# Patient Record
Sex: Female | Born: 1960 | Race: Black or African American | Hispanic: No | Marital: Single | Smoking: Current every day smoker
Health system: Southern US, Community
[De-identification: ages and names within clinical notes are randomized; demographics above are authoritative.]

## PROBLEM LIST (undated history)

## (undated) DIAGNOSIS — I639 Cerebral infarction, unspecified: Secondary | ICD-10-CM

## (undated) DIAGNOSIS — C801 Malignant (primary) neoplasm, unspecified: Secondary | ICD-10-CM

## (undated) DIAGNOSIS — D259 Leiomyoma of uterus, unspecified: Secondary | ICD-10-CM

## (undated) HISTORY — PX: DENTAL SURGERY: SHX609

---

## 2005-07-02 ENCOUNTER — Emergency Department: Payer: Self-pay | Admitting: Emergency Medicine

## 2005-07-18 ENCOUNTER — Emergency Department: Payer: Self-pay | Admitting: Unknown Physician Specialty

## 2005-07-26 ENCOUNTER — Ambulatory Visit: Payer: Self-pay | Admitting: Vascular Surgery

## 2006-05-08 ENCOUNTER — Emergency Department: Payer: Self-pay | Admitting: Emergency Medicine

## 2007-05-20 ENCOUNTER — Emergency Department: Payer: Self-pay | Admitting: Emergency Medicine

## 2008-01-05 ENCOUNTER — Inpatient Hospital Stay: Payer: Self-pay | Admitting: Unknown Physician Specialty

## 2009-01-13 ENCOUNTER — Emergency Department: Payer: Self-pay | Admitting: Emergency Medicine

## 2009-01-25 ENCOUNTER — Emergency Department: Payer: Self-pay | Admitting: Emergency Medicine

## 2009-10-07 ENCOUNTER — Ambulatory Visit: Payer: Self-pay

## 2009-10-16 ENCOUNTER — Emergency Department: Payer: Self-pay | Admitting: Emergency Medicine

## 2010-12-29 ENCOUNTER — Emergency Department: Payer: Self-pay

## 2011-04-07 ENCOUNTER — Inpatient Hospital Stay: Payer: Self-pay | Admitting: Internal Medicine

## 2012-02-01 ENCOUNTER — Emergency Department: Payer: Self-pay | Admitting: Emergency Medicine

## 2012-02-01 LAB — CBC
HGB: 13.6 g/dL (ref 12.0–16.0)
MCHC: 32.2 g/dL (ref 32.0–36.0)
MCV: 80 fL (ref 80–100)
Platelet: 231 10*3/uL (ref 150–440)
RBC: 5.3 10*6/uL — ABNORMAL HIGH (ref 3.80–5.20)
WBC: 5.1 10*3/uL (ref 3.6–11.0)

## 2012-02-02 LAB — COMPREHENSIVE METABOLIC PANEL
Anion Gap: 11 (ref 7–16)
BUN: 6 mg/dL — ABNORMAL LOW (ref 7–18)
Chloride: 108 mmol/L — ABNORMAL HIGH (ref 98–107)
Co2: 26 mmol/L (ref 21–32)
EGFR (African American): >60
EGFR (Non-African Amer.): >60
Osmolality: 285 (ref 275–301)
Potassium: 3.9 mmol/L (ref 3.5–5.1)
Sodium: 145 mmol/L (ref 136–145)

## 2012-02-02 LAB — URINALYSIS, COMPLETE
Bacteria: NONE SEEN
Glucose,UR: NEGATIVE mg/dL (ref 0–75)
Ketone: NEGATIVE
Nitrite: NEGATIVE
Ph: 5 (ref 4.5–8.0)
Protein: 30
RBC,UR: 1 /HPF (ref 0–5)
Squamous Epithelial: 2
WBC UR: 2 /HPF (ref 0–5)

## 2012-02-02 LAB — WET PREP, GENITAL

## 2012-02-06 ENCOUNTER — Emergency Department: Payer: Self-pay | Admitting: Emergency Medicine

## 2012-02-06 LAB — CBC
HCT: 39 % (ref 35.0–47.0)
HGB: 12.6 g/dL (ref 12.0–16.0)
MCH: 25.7 pg — ABNORMAL LOW (ref 26.0–34.0)
MCHC: 32.4 g/dL (ref 32.0–36.0)
RBC: 4.93 10*6/uL (ref 3.80–5.20)

## 2012-02-06 LAB — URINALYSIS, COMPLETE
Ketone: NEGATIVE
Leukocyte Esterase: NEGATIVE
Nitrite: NEGATIVE
Ph: 5 (ref 4.5–8.0)
Protein: NEGATIVE
RBC,UR: 1 /HPF (ref 0–5)
Specific Gravity: 1.005 (ref 1.003–1.030)

## 2012-08-10 ENCOUNTER — Emergency Department: Payer: Self-pay | Admitting: Emergency Medicine

## 2012-08-10 LAB — BASIC METABOLIC PANEL
Anion Gap: 11 (ref 7–16)
Calcium, Total: 9 mg/dL (ref 8.5–10.1)
Creatinine: 0.72 mg/dL (ref 0.60–1.30)
EGFR (African American): >60
EGFR (Non-African Amer.): >60
Glucose: 84 mg/dL (ref 65–99)
Osmolality: 273 (ref 275–301)
Potassium: 3.7 mmol/L (ref 3.5–5.1)
Sodium: 138 mmol/L (ref 136–145)

## 2012-08-10 LAB — CBC
HCT: 42.5 % (ref 35.0–47.0)
HGB: 14.1 g/dL (ref 12.0–16.0)
MCH: 25.8 pg — ABNORMAL LOW (ref 26.0–34.0)
MCHC: 33.1 g/dL (ref 32.0–36.0)
Platelet: 303 10*3/uL (ref 150–440)
RBC: 5.44 10*6/uL — ABNORMAL HIGH (ref 3.80–5.20)
WBC: 5.8 10*3/uL (ref 3.6–11.0)

## 2012-08-10 LAB — ETHANOL
Ethanol %: 0.286 % — ABNORMAL HIGH (ref 0.000–0.080)
Ethanol: 286 mg/dL

## 2012-12-17 ENCOUNTER — Emergency Department: Payer: Self-pay | Admitting: Emergency Medicine

## 2012-12-17 LAB — CBC
HCT: 40.9 % (ref 35.0–47.0)
MCH: 24.7 pg — ABNORMAL LOW (ref 26.0–34.0)
MCHC: 31.8 g/dL — ABNORMAL LOW (ref 32.0–36.0)
Platelet: 265 10*3/uL (ref 150–440)
RDW: 14.6 % — ABNORMAL HIGH (ref 11.5–14.5)
WBC: 6 10*3/uL (ref 3.6–11.0)

## 2012-12-17 LAB — COMPREHENSIVE METABOLIC PANEL
Albumin: 4 g/dL (ref 3.4–5.0)
Alkaline Phosphatase: 94 U/L (ref 50–136)
Bilirubin,Total: 0.3 mg/dL (ref 0.2–1.0)
Chloride: 105 mmol/L (ref 98–107)
EGFR (African American): >60
Osmolality: 271 (ref 275–301)
Potassium: 4 mmol/L (ref 3.5–5.1)
SGOT(AST): 34 U/L (ref 15–37)
SGPT (ALT): 32 U/L (ref 12–78)
Sodium: 137 mmol/L (ref 136–145)
Total Protein: 8.5 g/dL — ABNORMAL HIGH (ref 6.4–8.2)

## 2012-12-17 LAB — SALICYLATE LEVEL: Salicylates, Serum: 5 mg/dL — ABNORMAL HIGH

## 2012-12-17 LAB — DRUG SCREEN, URINE
Amphetamines, Ur Screen: NEGATIVE (ref ?–1000)
Benzodiazepine, Ur Scrn: NEGATIVE (ref ?–200)
Cannabinoid 50 Ng, Ur ~~LOC~~: NEGATIVE (ref ?–50)
MDMA (Ecstasy)Ur Screen: NEGATIVE (ref ?–500)
Methadone, Ur Screen: NEGATIVE (ref ?–300)
Opiate, Ur Screen: NEGATIVE (ref ?–300)
Tricyclic, Ur Screen: NEGATIVE (ref ?–1000)

## 2014-04-23 ENCOUNTER — Emergency Department: Payer: Self-pay | Admitting: Emergency Medicine

## 2014-04-23 LAB — CBC WITH DIFFERENTIAL/PLATELET
BASOS ABS: 0.1 10*3/uL (ref 0.0–0.1)
BASOS PCT: 1.6 %
EOS ABS: 0.1 10*3/uL (ref 0.0–0.7)
EOS PCT: 2.2 %
HCT: 42.5 % (ref 35.0–47.0)
HGB: 13.2 g/dL (ref 12.0–16.0)
LYMPHS PCT: 34.7 %
Lymphocyte #: 2.4 10*3/uL (ref 1.0–3.6)
MCH: 24.6 pg — AB (ref 26.0–34.0)
MCHC: 31 g/dL — AB (ref 32.0–36.0)
MCV: 79 fL — ABNORMAL LOW (ref 80–100)
MONOS PCT: 6.9 %
Monocyte #: 0.5 x10 3/mm (ref 0.2–0.9)
Neutrophil #: 3.8 10*3/uL (ref 1.4–6.5)
Neutrophil %: 54.6 %
Platelet: 279 10*3/uL (ref 150–440)
RBC: 5.36 10*6/uL — ABNORMAL HIGH (ref 3.80–5.20)
RDW: 14.7 % — ABNORMAL HIGH (ref 11.5–14.5)
WBC: 6.9 10*3/uL (ref 3.6–11.0)

## 2014-04-23 LAB — ETHANOL
ETHANOL LVL: 311 mg/dL — AB
Ethanol %: 0.311 % (ref 0.000–0.080)

## 2014-04-23 LAB — URINALYSIS, COMPLETE
BILIRUBIN, UR: NEGATIVE
Blood: NEGATIVE
Glucose,UR: NEGATIVE mg/dL (ref 0–75)
Ketone: NEGATIVE
Leukocyte Esterase: NEGATIVE
Nitrite: NEGATIVE
PH: 5 (ref 4.5–8.0)
Protein: NEGATIVE
RBC,UR: NONE SEEN /HPF (ref 0–5)
Specific Gravity: 1.002 (ref 1.003–1.030)
WBC UR: 1 /HPF (ref 0–5)

## 2014-04-23 LAB — COMPREHENSIVE METABOLIC PANEL
ALBUMIN: 3.8 g/dL (ref 3.4–5.0)
ANION GAP: 8 (ref 7–16)
Alkaline Phosphatase: 79 U/L
BUN: 9 mg/dL (ref 7–18)
Bilirubin,Total: 0.2 mg/dL (ref 0.2–1.0)
CALCIUM: 9.4 mg/dL (ref 8.5–10.1)
Chloride: 106 mmol/L (ref 98–107)
Co2: 27 mmol/L (ref 21–32)
Creatinine: 0.81 mg/dL (ref 0.60–1.30)
EGFR (African American): >60
Glucose: 102 mg/dL — ABNORMAL HIGH (ref 65–99)
Osmolality: 280 (ref 275–301)
POTASSIUM: 3.7 mmol/L (ref 3.5–5.1)
SGOT(AST): 23 U/L (ref 15–37)
SGPT (ALT): 32 U/L (ref 12–78)
Sodium: 141 mmol/L (ref 136–145)
Total Protein: 8.5 g/dL — ABNORMAL HIGH (ref 6.4–8.2)

## 2014-04-23 LAB — LIPASE, BLOOD: Lipase: 130 U/L (ref 73–393)

## 2014-04-23 LAB — DRUG SCREEN, URINE

## 2015-03-25 ENCOUNTER — Encounter: Payer: Self-pay | Admitting: Emergency Medicine

## 2015-03-25 DIAGNOSIS — M25511 Pain in right shoulder: Secondary | ICD-10-CM | POA: Insufficient documentation

## 2015-03-25 DIAGNOSIS — Z720 Tobacco use: Secondary | ICD-10-CM | POA: Insufficient documentation

## 2015-03-25 DIAGNOSIS — F10129 Alcohol abuse with intoxication, unspecified: Secondary | ICD-10-CM | POA: Insufficient documentation

## 2015-03-25 NOTE — ED Notes (Signed)
Pt to rm 25 via EMS.  EMS reports pt c/o right shoulder pain.  Pt states "it just hurts" and it has hurt "forever".  EMS reports pt w/ cough and c/o CP.  Pt reports cough x "forever" and reports pain located center chest described as throbbing.  Pt states she is unable to move arm, but moving arm during assessment.  Pt reports all she does is drink alcohol and wants help at this time.

## 2015-03-26 ENCOUNTER — Other Ambulatory Visit: Payer: Self-pay

## 2015-03-26 ENCOUNTER — Emergency Department
Admission: EM | Admit: 2015-03-26 | Discharge: 2015-03-26 | Disposition: A | Discharge status: Home / Self Care | Payer: Medicaid Other | Attending: Emergency Medicine | Admitting: Emergency Medicine

## 2015-03-26 ENCOUNTER — Emergency Department: Payer: Self-pay

## 2015-03-26 ENCOUNTER — Emergency Department: Payer: Medicaid Other

## 2015-03-26 ENCOUNTER — Encounter: Payer: Self-pay | Admitting: Emergency Medicine

## 2015-03-26 DIAGNOSIS — F10920 Alcohol use, unspecified with intoxication, uncomplicated: Secondary | ICD-10-CM

## 2015-03-26 HISTORY — DX: Leiomyoma of uterus, unspecified: D25.9

## 2015-03-26 HISTORY — DX: Cerebral infarction, unspecified: I63.9

## 2015-03-26 LAB — BASIC METABOLIC PANEL
Anion gap: 8 (ref 5–15)
BUN: 9 mg/dL (ref 6–20)
CO2: 27 mmol/L (ref 22–32)
Calcium: 8.9 mg/dL (ref 8.9–10.3)
Chloride: 106 mmol/L (ref 101–111)
Creatinine, Ser: 0.74 mg/dL (ref 0.44–1.00)
GFR calc Af Amer: >60 mL/min (ref >60)
Glucose, Bld: 94 mg/dL (ref 65–99)
POTASSIUM: 3.9 mmol/L (ref 3.5–5.1)
Sodium: 141 mmol/L (ref 135–145)

## 2015-03-26 LAB — CBC
HCT: 41.8 % (ref 35.0–47.0)
Hemoglobin: 13.3 g/dL (ref 12.0–16.0)
MCH: 24.3 pg — ABNORMAL LOW (ref 26.0–34.0)
MCHC: 31.7 g/dL — AB (ref 32.0–36.0)
MCV: 76.8 fL — ABNORMAL LOW (ref 80.0–100.0)
Platelets: 228 10*3/uL (ref 150–440)
RBC: 5.45 MIL/uL — ABNORMAL HIGH (ref 3.80–5.20)
RDW: 14 % (ref 11.5–14.5)
WBC: 5.2 10*3/uL (ref 3.6–11.0)

## 2015-03-26 LAB — ETHANOL: Alcohol, Ethyl (B): 354 mg/dL (ref <5)

## 2015-03-26 LAB — TROPONIN I: Troponin I: <0.03 ng/mL (ref <0.031)

## 2015-03-26 NOTE — Discharge Instructions (Signed)
Alcohol Intoxication  Alcohol intoxication occurs when the amount of alcohol that a person has consumed impairs his or her ability to mentally and physically function. Alcohol directly impairs the normal chemical activity of the brain. Drinking large amounts of alcohol can lead to changes in mental function and behavior, and it can cause many physical effects that can be harmful.   Alcohol intoxication can range in severity from mild to very severe. Various factors can affect the level of intoxication that occurs, such as the person's age, gender, weight, frequency of alcohol consumption, and the presence of other medical conditions (such as diabetes, seizures, or heart conditions). Dangerous levels of alcohol intoxication may occur when people drink large amounts of alcohol in a short period (binge drinking). Alcohol can also be especially dangerous when combined with certain prescription medicines or "recreational" drugs.  SIGNS AND SYMPTOMS  Some common signs and symptoms of mild alcohol intoxication include:  · Loss of coordination.  · Changes in mood and behavior.  · Impaired judgment.  · Slurred speech.  As alcohol intoxication progresses to more severe levels, other signs and symptoms will appear. These may include:  · Vomiting.  · Confusion and impaired memory.  · Slowed breathing.  · Seizures.  · Loss of consciousness.  DIAGNOSIS   Your health care provider will take a medical history and perform a physical exam. You will be asked about the amount and type of alcohol you have consumed. Blood tests will be done to measure the concentration of alcohol in your blood. In many places, your blood alcohol level must be lower than 80 mg/dL (0.08%) to legally drive. However, many dangerous effects of alcohol can occur at much lower levels.   TREATMENT   People with alcohol intoxication often do not require treatment. Most of the effects of alcohol intoxication are temporary, and they go away as the alcohol naturally  leaves the body. Your health care provider will monitor your condition until you are stable enough to go home. Fluids are sometimes given through an IV access tube to help prevent dehydration.   HOME CARE INSTRUCTIONS  · Do not drive after drinking alcohol.  · Stay hydrated. Drink enough water and fluids to keep your urine clear or pale yellow. Avoid caffeine.    · Only take over-the-counter or prescription medicines as directed by your health care provider.    SEEK MEDICAL CARE IF:   · You have persistent vomiting.    · You do not feel better after a few days.  · You have frequent alcohol intoxication. Your health care provider can help determine if you should see a substance use treatment counselor.  SEEK IMMEDIATE MEDICAL CARE IF:   · You become shaky or tremble when you try to stop drinking.    · You shake uncontrollably (seizure).    · You throw up (vomit) blood. This may be bright red or may look like black coffee grounds.    · You have blood in your stool. This may be bright red or may appear as a black, tarry, bad smelling stool.    · You become lightheaded or faint.    MAKE SURE YOU:   · Understand these instructions.  · Will watch your condition.  · Will get help right away if you are not doing well or get worse.  Document Released: 07/05/2005 Document Revised: 05/28/2013 Document Reviewed: 02/28/2013  Exit[employer]® Patient Information ©2015 [employer]. This information is not intended to replace advice given to you by your health care provider. Make sure   you discuss any questions you have with your health care provider.

## 2015-03-26 NOTE — ED Notes (Signed)
Intake nurse at bedside per Dr. Owens Shark request

## 2015-03-26 NOTE — ED Notes (Signed)
Pt's daughter called to pick up pt for d/c.  Pt's daughter states she will be here in 32min

## 2015-03-26 NOTE — ED Notes (Signed)
Pt's son present to pick up pt

## 2015-03-26 NOTE — ED Notes (Signed)
Pt states she no longer wants help for alcohol abuse

## 2015-03-26 NOTE — ED Notes (Signed)
Pt taken to CT.

## 2015-03-26 NOTE — ED Provider Notes (Signed)
[facility] Emergency Department Provider Note  ____________________________________________  Time seen: 12:15 AM  I have reviewed the triage vital signs and the nursing notes.   HISTORY  Chief Complaint Chest Pain and Arm Pain      HPI Analyah Molina is a 54 y.o. female presents with multiple complaints including right shoulder pain which she states has been hurting for "forever". Patient states "yet I'm an alcoholic and yeah I'm drunk". Patient also adm   Past Medical History  Diagnosis Date  . Stroke   . Uterine fibroid     There are no active problems to display for this patient.   History reviewed. No pertinent past surgical history.  No current outpatient prescriptions on file.  Allergies Review of patient's allergies indicates no known allergies.  History reviewed. No pertinent family history.  Social History History  Substance Use Topics  . Smoking status: Current Every Day Smoker  . Smokeless tobacco: Never Used  . Alcohol Use: Yes    Review of Systems  Constitutional: Negative for fever. Eyes: Negative for visual changes. ENT: Negative for sore throat. Cardiovascular: Negative for chest pain. Respiratory: Negative for shortness of breath. Gastrointestinal: Negative for abdominal pain, vomiting and diarrhea. Genitourinary: Negative for dysuria. Musculoskeletal: Negative for back pain. Positive for right shoulder pain Skin: Negative for rash. Neurological: Negative for headaches, positive for focal weakness or numbness.   10-point ROS otherwise negative.  ____________________________________________   PHYSICAL EXAM:  VITAL SIGNS: ED Triage Vitals  Enc Vitals Group     BP 03/26/15 0001 163/102 mmHg     Pulse Rate 03/26/15 0001 92     Resp 03/26/15 0001 20     Temp 03/26/15 0001 97.5 F (36.4 C)     Temp Source 03/26/15 0001 Oral     SpO2 03/26/15 0001 96 %     Weight 03/25/15 2356 140 lb (63.504 kg)     Height  03/25/15 2356 5\' 4"  (1.626 m)     Head Cir --      Peak Flow --      Pain Score 03/25/15 2357 10     Pain Loc --      Pain Edu? --      Excl. in [place]? --      Constitutional: Alert and oriented. Clinically intoxicated with EtOH on breath Eyes: Conjunctivae are normal. PERRL. Normal extraocular movements. ENT   Head: Normocephalic and atraumatic.   Nose: No congestion/rhinnorhea.   Mouth/Throat: Mucous membranes are moist.   Neck: No stridor. Hematological/Lymphatic/Immunilogical: No cervical lymphadenopathy. Cardiovascular: Normal rate, regular rhythm. Normal and symmetric distal pulses are present in all extremities. No murmurs, rubs, or gallops. Respiratory: Normal respiratory effort without tachypnea nor retractions. Breath sounds are clear and equal bilaterally. No wheezes/rales/rhonchi. Gastrointestinal: Soft and nontender. No distention. There is no CVA tenderness. Genitourinary: deferred Musculoskeletal: Nontender with normal range of motion in all extremities. No joint effusions.  No lower extremity tenderness nor edema. Neurologic:  Normal speech and language. No gross focal neurologic deficits are appreciated. Speech is normal.  Skin:  Skin is warm, dry and intact. No rash noted. Psychiatric: Mood and affect are normal. Speech and behavior are normal. Patient exhibits appropriate insight and judgment.  ____________________________________________    LABS (pertinent positives/negatives)  Labs Reviewed  CBC - Abnormal; Notable for the following:    RBC 5.45 (*)    MCV 76.8 (*)    MCH 24.3 (*)    MCHC 31.7 (*)    All  other components within normal limits  ETHANOL - Abnormal; Notable for the following:    Alcohol, Ethyl (B) 354 (*)    All other components within normal limits  BASIC METABOLIC PANEL  TROPONIN I     ____________________________________________   EKG   Date: 03/26/2015  Rate: 105  Rhythm: Sinus tachycardia  QRS Axis: normal   Intervals: normal  ST/T Wave abnormalities: normal  Conduction Disutrbances: none  Narrative Interpretation: unremarkable      ____________________________________________    RADIOLOGY  CT head revealed no intracranial abnormalities per radiology  ____________________________________________     INITIAL IMPRESSION / ASSESSMENT AND PLAN / ED COURSE  Pertinent labs & imaging results that were available during my care of the patient were reviewed by me and considered in my medical decision making (see chart for details).  I offered all call detox patient which she was agreeable while her family was present however shortly after their departure the patient stated that she no longer want any help for alcohol abuse. Patient requested that we call her daughter to pick her up and that she was ready to be discharged. ____________________________________________   FINAL CLINICAL IMPRESSION(S) / ED DIAGNOSES  Final diagnoses:  Alcohol intoxication, uncomplicated      Gregor Hams, MD 03/26/15 0501

## 2016-04-14 ENCOUNTER — Emergency Department
Admission: EM | Admit: 2016-04-14 | Discharge: 2016-04-14 | Disposition: A | Discharge status: Home / Self Care | Payer: Medicaid Other | Attending: Emergency Medicine | Admitting: Emergency Medicine

## 2016-04-14 ENCOUNTER — Encounter: Payer: Self-pay | Admitting: Emergency Medicine

## 2016-04-14 DIAGNOSIS — Z8673 Personal history of transient ischemic attack (TIA), and cerebral infarction without residual deficits: Secondary | ICD-10-CM | POA: Insufficient documentation

## 2016-04-14 DIAGNOSIS — F17200 Nicotine dependence, unspecified, uncomplicated: Secondary | ICD-10-CM | POA: Insufficient documentation

## 2016-04-14 DIAGNOSIS — F419 Anxiety disorder, unspecified: Secondary | ICD-10-CM | POA: Insufficient documentation

## 2016-04-14 DIAGNOSIS — F410 Panic disorder [episodic paroxysmal anxiety] without agoraphobia: Secondary | ICD-10-CM

## 2016-04-14 LAB — CBC
HCT: 40.6 % (ref 35.0–47.0)
Hemoglobin: 13.2 g/dL (ref 12.0–16.0)
MCH: 25 pg — AB (ref 26.0–34.0)
MCHC: 32.6 g/dL (ref 32.0–36.0)
MCV: 76.8 fL — AB (ref 80.0–100.0)
PLATELETS: 183 10*3/uL (ref 150–440)
RBC: 5.29 MIL/uL — ABNORMAL HIGH (ref 3.80–5.20)
RDW: 14.9 % — ABNORMAL HIGH (ref 11.5–14.5)
WBC: 5 10*3/uL (ref 3.6–11.0)

## 2016-04-14 LAB — TROPONIN I: Troponin I: <0.03 ng/mL (ref <0.03)

## 2016-04-14 LAB — BASIC METABOLIC PANEL
Anion gap: 13 (ref 5–15)
BUN: 5 mg/dL — ABNORMAL LOW (ref 6–20)
CO2: 22 mmol/L (ref 22–32)
CREATININE: 0.57 mg/dL (ref 0.44–1.00)
Calcium: 9.2 mg/dL (ref 8.9–10.3)
Chloride: 101 mmol/L (ref 101–111)
GFR calc non Af Amer: >60 mL/min (ref >60)
GLUCOSE: 94 mg/dL (ref 65–99)
Potassium: 3.9 mmol/L (ref 3.5–5.1)
Sodium: 136 mmol/L (ref 135–145)

## 2016-04-14 NOTE — ED Provider Notes (Signed)
[facility] Emergency Department Provider Note   ____________________________________________    I have reviewed the triage vital signs and the nursing notes.   HISTORY  Chief Complaint Weakness     HPI Michele Molina is a 55 y.o. female who presents with multiple complaints.Patient reports earlier this morning she had an episode where she became very anxious and reports that her hands spasmed bilaterally. She felt so anxious that she couldn't speak. She reports she feels better now. She denies neuro deficits. She has had a similar episode in the past. She tells me her primary complaint is that she has uterine fibroids and needs these removed. She denies abdominal pain but does report intermittent vaginal bleeding from her fibroids. She has discussed this with her PCP apparently.   Past Medical History  Diagnosis Date  . Stroke ([place])   . Uterine fibroid     There are no active problems to display for this patient.   Past Surgical History  Procedure Laterality Date  . Dental surgery      No current outpatient prescriptions on file.  Allergies Review of patient's allergies indicates no known allergies.  No family history on file.  Social History Social History  Substance Use Topics  . Smoking status: Current Every Day Smoker  . Smokeless tobacco: Never Used  . Alcohol Use: Yes    Review of Systems  Constitutional: No fever/chills Eyes: No visual changes. No discharge ENT: No Neck pain Cardiovascular: Denies chest pain. Respiratory: Denies shortness of breath. Gastrointestinal: No abdominal pain.  No nausea, no vomiting.   Genitourinary: Negative for dysuria. Musculoskeletal: Negative for back pain. Skin: Negative for rash. Neurological: Negative for headaches or weakness Psychiatric: Positive for anxiety 10-point ROS otherwise negative.  ____________________________________________   PHYSICAL EXAM:  VITAL SIGNS: ED  Triage Vitals  Enc Vitals Group     BP 04/14/16 1254 161/90 mmHg     Pulse Rate 04/14/16 1254 94     Resp 04/14/16 1254 22     Temp 04/14/16 1254 99.4 F (37.4 C)     Temp Source 04/14/16 1254 Oral     SpO2 04/14/16 1254 100 %     Weight 04/14/16 1254 130 lb (58.968 kg)     Height 04/14/16 1254 5\' 4"  (1.626 m)     Head Cir --      Peak Flow --      Pain Score 04/14/16 1255 10     Pain Loc --      Pain Edu? --      Excl. in [place]? --     Constitutional: Alert and oriented. No acute distress. Highly anxious and tearful Eyes: Conjunctivae are normal. PERRLA, EOMI Head: Atraumatic.Normocephalic Nose: No congestion/rhinnorhea. Mouth/Throat: Mucous membranes are moist.  Oropharynx non-erythematous. Neck: Painless ROM Cardiovascular: Normal rate, regular rhythm. Grossly normal heart sounds.  Good peripheral circulation. Respiratory: Normal respiratory effort.  No retractions. Lungs CTAB. Gastrointestinal: Soft and nontender. No distention.  No CVA tenderness. Genitourinary: deferred Musculoskeletal: No lower extremity tenderness nor edema.  Warm and well perfused Neurologic:  Normal speech and language. No gross focal neurologic deficits are appreciated. Cranial nerves II through XII are normal Skin:  Skin is warm, dry and intact. No rash noted. Psychiatric: Mood and affect are normal. Speech and behavior are normal.  ____________________________________________   LABS (all labs ordered are listed, but only abnormal results are displayed)  Labs Reviewed  BASIC METABOLIC PANEL - Abnormal; Notable for the following:  BUN 5 (*)    All other components within normal limits  CBC - Abnormal; Notable for the following:    RBC 5.29 (*)    MCV 76.8 (*)    MCH 25.0 (*)    RDW 14.9 (*)    All other components within normal limits  TROPONIN I  URINALYSIS COMPLETEWITH MICROSCOPIC ([facility] ONLY)   ____________________________________________  EKG  ED ECG REPORT I, Lavonia Drafts, the  attending physician, personally viewed and interpreted this ECG.  Date: 04/14/2016 EKG Time: 1:03 PM Rate: 83 Rhythm: normal sinus rhythm QRS Axis: normal Intervals: normal ST/T Wave abnormalities: normal Conduction Disturbances: none   ____________________________________________  RADIOLOGY  None ____________________________________________   PROCEDURES  Procedure(s) performed: No    Critical Care performed: No ____________________________________________   INITIAL IMPRESSION / ASSESSMENT AND PLAN / ED COURSE  Pertinent labs & imaging results that were available during my care of the patient were reviewed by me and considered in my medical decision making (see chart for details).  Patient is anxious but in no acute distress upon my exam. She is completely neurologically intact. Her description of her event this morning sounds like carpopedal spasm given bilateral hand involvement and no neuro deficits. Regarding her uterine fibroids I recommended that we refer her to gynecology which she agreed with. She is overall well-appearing. Do not feel additional workup is necessary at this time. ____________________________________________   FINAL CLINICAL IMPRESSION(S) / ED DIAGNOSES  Final diagnoses:  Anxiety attack      NEW MEDICATIONS STARTED DURING THIS VISIT:  There are no discharge medications for this patient.    Note:  This document was prepared using Dragon voice recognition software and may include unintentional dictation errors.    Lavonia Drafts, MD 04/14/16 [phone]

## 2016-04-14 NOTE — ED Notes (Signed)
Patient was witnessed by this rn walking quickly, unassisted, with an equal balanced gait down the hallway stating "this is too much, this is too scary, i cant.Marland Kitcheni cant" Patient then exited the facility through triage waiting.

## 2016-04-14 NOTE — ED Notes (Signed)
Pt alert and oriented X4, active, cooperative, pt in NAD. RR even and unlabored, color WNL.  Pt informed to return if any life threatening symptoms occur.   

## 2016-04-14 NOTE — ED Notes (Signed)
Patient to ER via [facility] for c/o "I'm having a stroke". Patient states she has h/o CVA x2. Patient states she believes she is having stroke because "hands aren't working and my speech is messed up". Per EMS, patient was very anxious upon their arrival and respirations were 60. Patient's hands appeared cramped bilaterally (unable to test grip strength at this time). No noted slurred speech or difficulty with speech. Patient continues to talk about her appointment at [facility] for uterine fibroids, and needs surgery.

## 2016-04-14 NOTE — Discharge Instructions (Signed)

## 2017-07-24 ENCOUNTER — Encounter: Payer: Self-pay | Admitting: Emergency Medicine

## 2017-07-24 ENCOUNTER — Emergency Department
Admission: EM | Admit: 2017-07-24 | Discharge: 2017-07-24 | Disposition: A | Discharge status: Home / Self Care | Payer: Self-pay | Attending: Emergency Medicine | Admitting: Emergency Medicine

## 2017-07-24 DIAGNOSIS — F329 Major depressive disorder, single episode, unspecified: Secondary | ICD-10-CM

## 2017-07-24 DIAGNOSIS — F17200 Nicotine dependence, unspecified, uncomplicated: Secondary | ICD-10-CM | POA: Insufficient documentation

## 2017-07-24 DIAGNOSIS — I10 Essential (primary) hypertension: Secondary | ICD-10-CM | POA: Insufficient documentation

## 2017-07-24 DIAGNOSIS — R21 Rash and other nonspecific skin eruption: Secondary | ICD-10-CM | POA: Insufficient documentation

## 2017-07-24 DIAGNOSIS — F32A Depression, unspecified: Secondary | ICD-10-CM

## 2017-07-24 MED ORDER — HYDROXYZINE HCL 25 MG PO TABS
25.0000 mg | ORAL_TABLET | Freq: Once | ORAL | Status: AC
Start: 2017-07-24 — End: 2017-07-24
  Administered 2017-07-24: 25 mg via ORAL
  Filled 2017-07-24: qty 1

## 2017-07-24 MED ORDER — PREDNISONE 10 MG PO TABS: ORAL_TABLET | ORAL | 0 refills | Status: AC

## 2017-07-24 MED ORDER — DEXAMETHASONE SODIUM PHOSPHATE 10 MG/ML IJ SOLN
10.0000 mg | Freq: Once | INTRAMUSCULAR | Status: AC
  Administered 2017-07-24: 10 mg via INTRAMUSCULAR
  Filled 2017-07-24: qty 1

## 2017-07-24 MED ORDER — HYDROXYZINE HCL 25 MG PO TABS: 25.0000 mg | ORAL_TABLET | Freq: Four times a day (QID) | ORAL | 0 refills | Status: AC | PRN

## 2017-07-24 NOTE — ED Triage Notes (Signed)
Pt presents with rash all over, states it itches.

## 2017-07-24 NOTE — ED Provider Notes (Signed)
[facility] Emergency Department Provider Note  ____________________________________________   First MD Initiated Contact with Patient 07/24/17 1236     (approximate)  I have reviewed the triage vital signs and the nursing notes.   HISTORY  Chief Complaint Rash   HPI Michele Molina is a 56 y.o. female is here complaining of rash.patient states that this began  approximately one week ago at the base of her scalp. She states initially it began itching and gradually getting worse. It was so bad that she decided to cut her hair. She's been using topical over-the-counter products without any relief.   Past Medical History:  Diagnosis Date  . Stroke ([place])   . Uterine fibroid     Patient Active Problem List   Diagnosis Date Noted  . Hypertension 07/24/2017  . Depression 07/24/2017    Past Surgical History:  Procedure Laterality Date  . DENTAL SURGERY      Prior to Admission medications   Medication Sig Start Date End Date Taking? Authorizing Provider  hydrOXYzine (ATARAX/VISTARIL) 25 MG tablet Take 1 tablet (25 mg total) by mouth every 6 (six) hours as needed for itching. 07/24/17   Johnn Hai, PA-C  predniSONE (DELTASONE) 10 MG tablet Take 6 tablets  today, on day 2 take 5 tablets, day 3 take 4 tablets, day 4 take 3 tablets, day 5 take  2 tablets and 1 tablet the last day 07/24/17   Johnn Hai, PA-C    Allergies Patient has no known allergies.  No family history on file.  Social History Social History  Substance Use Topics  . Smoking status: Current Every Day Smoker  . Smokeless tobacco: Never Used  . Alcohol use Yes    Review of Systems Constitutional: No fever/chills Cardiovascular: Denies chest pain. Respiratory: Denies shortness of breath. Gastrointestinal:   No nausea, no vomiting.  Musculoskeletal: Negative for back pain. Skin: positive for rash. Neurological: Negative for focal weakness or  numbness.   ____________________________________________   PHYSICAL EXAM:  VITAL SIGNS: ED Triage Vitals  Enc Vitals Group     BP 07/24/17 1148 (!) 154/90     Pulse Rate 07/24/17 1148 84     Resp 07/24/17 1148 20     Temp 07/24/17 1148 98.7 F (37.1 C)     Temp Source 07/24/17 1148 Oral     SpO2 07/24/17 1148 97 %     Weight 07/24/17 1147 130 lb (59 kg)     Height --      Head Circumference --      Peak Flow --      Pain Score --      Pain Loc --      Pain Edu? --      Excl. in [place]? --    Constitutional: Alert and oriented. Well appearing and in no acute distress. Eyes: Conjunctivae are normal.  Head: Atraumatic. Neck: No stridor.   Cardiovascular: Normal rate, regular rhythm. Grossly normal heart sounds.  Good peripheral circulation. Respiratory: Normal respiratory effort.  No retractions. Lungs CTAB. Musculoskeletal: his upper and lower extremities without any difficulty. Normal gait was noted. Neurologic:  Normal speech and language. No gross focal neurologic deficits are appreciated. No gait instability. Skin:  Skin is warm, dry and intact. There is a papular rash at the base of the scalp and extending bilateral soft tissues of the neck. There is no papular lesions noted on the trunk or lower extremities. Original area at the base of the scalp  is excoriated from patient scratching. No drainage is noted. Psychiatric: Mood and affect are normal. Speech and behavior are normal.  ____________________________________________   LABS (all labs ordered are listed, but only abnormal results are displayed)  Labs Reviewed - No data to display   PROCEDURES  Procedure(s) performed: None  Procedures  Critical Care performed: No  ____________________________________________   INITIAL IMPRESSION / ASSESSMENT AND PLAN / ED COURSE  Patient improved after getting Decadron 10 mg IM and Atarax 25 mg by mouth. Patient will continue with a tapering dose of prednisone beginning  with 60 mg. She is also given a prescription for Atarax 25 mg 1 every 6 hours as needed for itching.  If not improving she is to follow-up with [place] or Dr. Evorn Gong.   ___________________________________________   FINAL CLINICAL IMPRESSION(S) / ED DIAGNOSES  Final diagnoses:  Rash and nonspecific skin eruption      NEW MEDICATIONS STARTED DURING THIS VISIT:  Discharge Medication List as of 07/24/2017  1:40 PM    START taking these medications   Details  hydrOXYzine (ATARAX/VISTARIL) 25 MG tablet Take 1 tablet (25 mg total) by mouth every 6 (six) hours as needed for itching., Starting Tue 07/24/2017, Print    predniSONE (DELTASONE) 10 MG tablet Take 6 tablets  today, on day 2 take 5 tablets, day 3 take 4 tablets, day 4 take 3 tablets, day 5 take  2 tablets and 1 tablet the last day, Print         Note:  This document was prepared using Dragon voice recognition software and may include unintentional dictation errors.    Johnn Hai, PA-C 07/24/17 [place], [place], MD 07/25/17 1130

## 2017-07-24 NOTE — Discharge Instructions (Signed)
Follow-up with one of the dermatologist (skin specialist)listed on your discharge papers if not improving. Today you are given a steroid shot to help with your rash along with Atarax for itching. Continue with Atarax every 6 hours as needed for itching. Begin taking prednisone tablets as directed beginning with 6 tablets today.You will taper down over the next 6 days with this medication.

## 2018-11-29 ENCOUNTER — Emergency Department: Payer: Self-pay

## 2018-11-29 ENCOUNTER — Other Ambulatory Visit: Payer: Self-pay

## 2018-11-29 ENCOUNTER — Emergency Department
Admission: EM | Admit: 2018-11-29 | Discharge: 2018-11-29 | Disposition: A | Discharge status: Home / Self Care | Payer: Self-pay | Attending: Emergency Medicine | Admitting: Emergency Medicine

## 2018-11-29 DIAGNOSIS — Y92099 Unspecified place in other non-institutional residence as the place of occurrence of the external cause: Secondary | ICD-10-CM | POA: Insufficient documentation

## 2018-11-29 DIAGNOSIS — S0001XA Abrasion of scalp, initial encounter: Secondary | ICD-10-CM

## 2018-11-29 DIAGNOSIS — F10120 Alcohol abuse with intoxication, uncomplicated: Secondary | ICD-10-CM | POA: Insufficient documentation

## 2018-11-29 DIAGNOSIS — W07XXXA Fall from chair, initial encounter: Secondary | ICD-10-CM | POA: Insufficient documentation

## 2018-11-29 DIAGNOSIS — F17210 Nicotine dependence, cigarettes, uncomplicated: Secondary | ICD-10-CM | POA: Insufficient documentation

## 2018-11-29 DIAGNOSIS — Z720 Tobacco use: Secondary | ICD-10-CM

## 2018-11-29 DIAGNOSIS — Y999 Unspecified external cause status: Secondary | ICD-10-CM | POA: Insufficient documentation

## 2018-11-29 DIAGNOSIS — S0990XA Unspecified injury of head, initial encounter: Secondary | ICD-10-CM | POA: Insufficient documentation

## 2018-11-29 DIAGNOSIS — R059 Cough, unspecified: Secondary | ICD-10-CM

## 2018-11-29 DIAGNOSIS — Z79899 Other long term (current) drug therapy: Secondary | ICD-10-CM | POA: Insufficient documentation

## 2018-11-29 DIAGNOSIS — I10 Essential (primary) hypertension: Secondary | ICD-10-CM | POA: Insufficient documentation

## 2018-11-29 DIAGNOSIS — F10920 Alcohol use, unspecified with intoxication, uncomplicated: Secondary | ICD-10-CM

## 2018-11-29 DIAGNOSIS — Y9389 Activity, other specified: Secondary | ICD-10-CM | POA: Insufficient documentation

## 2018-11-29 DIAGNOSIS — R05 Cough: Secondary | ICD-10-CM | POA: Insufficient documentation

## 2018-11-29 MED ORDER — IPRATROPIUM-ALBUTEROL 0.5-2.5 (3) MG/3ML IN SOLN
3.0000 mL | Freq: Once | RESPIRATORY_TRACT | Status: AC
  Administered 2018-11-29: 3 mL via RESPIRATORY_TRACT
  Filled 2018-11-29: qty 3

## 2018-11-29 NOTE — ED Notes (Signed)
Patient transported to CT 

## 2018-11-29 NOTE — ED Notes (Signed)
Fall band applied, bed rails up and bed in lowest position. Door remaining open, call bell in place. Pt shoes tied and remaining on. Pt instructed not to get up and to use call bell.

## 2018-11-29 NOTE — ED Provider Notes (Signed)
[facility] Emergency Department Provider Note  ____________________________________________  Time seen: Approximately 6:14 PM  I have reviewed the triage vital signs and the nursing notes.   HISTORY  Chief Complaint Head Laceration and Alcohol Intoxication    HPI Michele Molina is a 58 y.o. female with a history of HTN, CVA, alcohol abuse, ongoing tobacco abuse, presenting for fall.  The patient reports "today I drink more than I usually drink," and she describes being in a rocking chair and falling backwards, striking her head.  She did not lose consciousness.  She was able to ambulate after the fall.  She did notice some blood in the back of her head.  She does not know whether she has neck pain.  She denies any changes in vision, numbness tingling or weakness.  She has not had any chest pain or shortness of breath.  She does have a cough on my exam, but states that this is chronic and unchanged; she has not had any associated congestion or rhinorrhea, ear pain, sore throat, fevers or chills.  Past Medical History:  Diagnosis Date  . Stroke ([place])   . Uterine fibroid     Patient Active Problem List   Diagnosis Date Noted  . Hypertension 07/24/2017  . Depression 07/24/2017    Past Surgical History:  Procedure Laterality Date  . DENTAL SURGERY      Current Outpatient Rx  . Order #: [id number] Class: Print  . Order #: [id number] Class: Print    Allergies Patient has no known allergies.  History reviewed. No pertinent family history.  Social History Social History   Tobacco Use  . Smoking status: Current Every Day Smoker  . Smokeless tobacco: Never Used  Substance Use Topics  . Alcohol use: Yes  . Drug use: No    Review of Systems Constitutional: No fever/chills.  No lightheadedness or syncope.  Positive fall.  No loss of consciousness. Eyes: No visual changes.  No blurred or double vision. ENT: No sore throat. No congestion or rhinorrhea.   Positive bleeding on the posterior scalp. Cardiovascular: Denies chest pain. Denies palpitations. Respiratory: Denies shortness of breath.  Has a chronic unchanged cough. Gastrointestinal: No abdominal pain.  No nausea, no vomiting.  No diarrhea.  No constipation. Genitourinary: Negative for dysuria. Musculoskeletal: Negative for back pain.  Unsure whether she has neck pain.  No extremity pain. Skin: Negative for rash. Neurological: Negative for headaches. No focal numbness, tingling or weakness.  Psychiatric:As of acute alcohol intoxication.  ____________________________________________   PHYSICAL EXAM:  VITAL SIGNS: ED Triage Vitals  Enc Vitals Group     BP 11/29/18 1733 (!) 142/114     Pulse Rate 11/29/18 1733 (!) 102     Resp 11/29/18 1733 15     Temp 11/29/18 1733 98.2 F (36.8 C)     Temp Source 11/29/18 1733 Oral     SpO2 11/29/18 1727 96 %     Weight 11/29/18 1729 134 lb (60.8 kg)     Height 11/29/18 1729 5\' 4"  (1.626 m)     Head Circumference --      Peak Flow --      Pain Score 11/29/18 1729 10     Pain Loc --      Pain Edu? --      Excl. in [place]? --     Constitutional: Alert and oriented. Answers questions appropriately.  Patient does have signs of intoxication, including some mildly slurred speech.  GCS is 15. Eyes: Conjunctivae are  normal.  EOMI. PERRLA.  No raccoon eyes.  No scleral icterus. Head: On the posterior scalp in the midline, the patient has an area of blood that is approximately 4 cm x 1 cm, but I am unable to find [place] laceration.  She does have a thick amount of hair have asked the nurse to clean the area to see if she is able to find a laceration; this may just be an abrasion.. Nose: No congestion/rhinnorhea.  Swelling over the nose or septal hematoma. Mouth/Throat: Mucous membranes are moist.  No dental injury or malocclusion. Neck: No stridor.  Supple.  No midline C-spine tenderness to palpation, step-offs or deformities.  Full range of motion  without pain. Cardiovascular: Normal rate, regular rhythm. No murmurs, rubs or gallops.  Respiratory: Normal respiratory effort.  No accessory muscle use or retractions. Lungs CTAB.  End expiratory wheezing without rales or rhonchi. Gastrointestinal: Soft, nontender and nondistended.  No guarding or rebound.  No peritoneal signs. Musculoskeletal: Pelvis is stable.  The patient moves all 4 extremities without any pain.  No LE edema. No ttp in the calves or palpable cords.  Negative Homan's sign.  No T or L-spine tenderness to palpation, step-offs or deformities. Neurologic:  A&Ox3.  Speech is clear.  Face and smile are symmetric.  EOMI.  Moves all extremities well. Skin:  Skin is warm, dry and intact. No rash noted. Psychiatric: Patient is intermittently agitated but able to be calmed with verbal reasoning.  ____________________________________________   LABS (all labs ordered are listed, but only abnormal results are displayed)  Labs Reviewed - No data to display ____________________________________________  EKG  ED ECG REPORT I, Anne-Caroline Mariea Clonts, the attending physician, personally viewed and interpreted this ECG.   Date: 11/29/2018  EKG Time: 2121  Rate: 88  Rhythm: normal sinus rhythm  Axis: normal  Intervals:none  ST&T Change: No STEMI  ____________________________________________  RADIOLOGY  Dg Chest 2 View  Result Date: 11/29/2018 CLINICAL DATA:  [place] out of chair, struck back of head. EXAM: CHEST - 2 VIEW COMPARISON:  Chest radiograph March 26, 2015 FINDINGS: Cardiac silhouette is mildly enlarged. Mediastinal silhouette is not suspicious. No pleural effusion or focal consolidation. No pneumothorax. Tiny biopsy clip versus calcification projecting in RIGHT breast. Surgical clips in the included right abdomen compatible with cholecystectomy. IMPRESSION: Mild cardiomegaly.  No acute pulmonary process. Electronically Signed   By: Elon Alas M.D.   On: 11/29/2018 19:26    Dg Cervical Spine Complete  Result Date: 11/29/2018 CLINICAL DATA:  [place] out of chair, struck back of head. EXAM: CERVICAL SPINE - COMPLETE 4+ VIEW COMPARISON:  None. FINDINGS: Cervical vertebral bodies intact and aligned. Maintenance of the cervical lordosis. Moderate C5-6 disc height loss with C4-5 and C5-6 ventral endplate spurring. No destructive bony lesions. Lateral masses in alignment. Calcifications in lateral neck are likely vascular. IMPRESSION: 1. No fracture deformity or malalignment. 2. Moderate C5-6 degenerative disc. Electronically Signed   By: Elon Alas M.D.   On: 11/29/2018 19:25   Ct Head Wo Contrast  Result Date: 11/29/2018 CLINICAL DATA:  Patient fell out of a chair and hit head on floor. EXAM: CT HEAD WITHOUT CONTRAST TECHNIQUE: Contiguous axial images were obtained from the base of the skull through the vertex without intravenous contrast. COMPARISON:  03/26/2015 FINDINGS: Brain: There is no evidence for acute hemorrhage, hydrocephalus, mass lesion, or abnormal extra-axial fluid collection. No definite CT evidence for acute infarction. Vascular: No hyperdense vessel or unexpected calcification. Skull: No evidence for fracture.  No worrisome lytic or sclerotic lesion. Sinuses/Orbits: The visualized paranasal sinuses and mastoid air cells are clear. Visualized portions of the globes and intraorbital fat are unremarkable. Other: None. IMPRESSION: Stable.  No acute intracranial abnormality. Electronically Signed   By: Misty Stanley M.D.   On: 11/29/2018 19:13    ____________________________________________   PROCEDURES  Procedure(s) performed: None  Procedures  Critical Care performed: No ____________________________________________   INITIAL IMPRESSION / ASSESSMENT AND PLAN / ED COURSE  Pertinent labs & imaging results that were available during my care of the patient were reviewed by me and considered in my medical decision making (see chart for details).  58  y.o. female with a history of alcohol dependence, not anticoagulated, presenting with a fall, posterior head injury, possible neck pain.  Overall, the patient is mentating normally without any focal neurologic deficits.  Given her alcohol intoxication, will get a CT of her head, and x-ray of her C-spine.  In addition, the patient is coughing and while she does have chronic cough due to ongoing tobacco use, her alcohol abuse does increase her risk of aspiration and will get a chest x-ray for evaluation of pneumonia.  Plan reevaluation for final disposition.  ----------------------------------------- 7:56 PM on 11/29/2018 -----------------------------------------  The patient's work-up in the emergency department has been reassuring.  Her trauma evaluation is negative.  Her CT scan does not show any acute intracranial process, her C-spine x-ray does not show any acute fractures.  She has been having a cough and received a DuoNeb, and her wheezing has resolved.  Her chest x-ray does not show pneumonia.  The patient has been ambulatory, and eating and drinking without difficulty.  At this time, the patient is safe for discharge home.  Follow-up instructions as well as return precautions were discussed.    ____________________________________________  FINAL CLINICAL IMPRESSION(S) / ED DIAGNOSES  Final diagnoses:  Alcoholic intoxication without complication ([place])  Abrasion of scalp, initial encounter  Cough  Tobacco abuse         NEW MEDICATIONS STARTED DURING THIS VISIT:  New Prescriptions   No medications on file      Eula Listen, MD 11/29/18 2123

## 2018-11-29 NOTE — Discharge Instructions (Addendum)
Please drink in moderation, or do not drink at all.  Do not stop drinking cold [place], as this can lead to life-threatening seizures.  If you would like to quit drinking alcohol, please see your primary care physician for help doing this safely.  You have an abrasion over the backside of your head; there is no cut there that needs any staples or sutures.  You may wash your hair, but please do so carefully.  Return to the emergency department if you develop severe pain, nausea or vomiting, changes in vision speech or mental status, or any other symptoms concerning to you.

## 2018-11-29 NOTE — ED Triage Notes (Signed)
Pt to ED via EMS from home. Pt fell out of chair and hit head on floor. Pt states LOC. ETOH on board. Pt tearful and aggravated at this time.

## 2019-06-07 ENCOUNTER — Emergency Department
Admission: EM | Admit: 2019-06-07 | Discharge: 2019-06-08 | Disposition: A | Discharge status: Home / Self Care | Payer: Self-pay | Attending: Emergency Medicine | Admitting: Emergency Medicine

## 2019-06-07 ENCOUNTER — Emergency Department: Payer: Self-pay

## 2019-06-07 ENCOUNTER — Other Ambulatory Visit: Payer: Self-pay

## 2019-06-07 ENCOUNTER — Encounter: Payer: Self-pay | Admitting: Emergency Medicine

## 2019-06-07 DIAGNOSIS — Z8673 Personal history of transient ischemic attack (TIA), and cerebral infarction without residual deficits: Secondary | ICD-10-CM | POA: Insufficient documentation

## 2019-06-07 DIAGNOSIS — M25532 Pain in left wrist: Secondary | ICD-10-CM | POA: Insufficient documentation

## 2019-06-07 DIAGNOSIS — I10 Essential (primary) hypertension: Secondary | ICD-10-CM | POA: Insufficient documentation

## 2019-06-07 DIAGNOSIS — W19XXXA Unspecified fall, initial encounter: Secondary | ICD-10-CM

## 2019-06-07 DIAGNOSIS — F17200 Nicotine dependence, unspecified, uncomplicated: Secondary | ICD-10-CM | POA: Insufficient documentation

## 2019-06-07 DIAGNOSIS — Z23 Encounter for immunization: Secondary | ICD-10-CM | POA: Insufficient documentation

## 2019-06-07 HISTORY — DX: Malignant (primary) neoplasm, unspecified: C80.1

## 2019-06-07 LAB — BASIC METABOLIC PANEL
Anion gap: 11 (ref 5–15)
BUN: 11 mg/dL (ref 6–20)
CO2: 23 mmol/L (ref 22–32)
Calcium: 9 mg/dL (ref 8.9–10.3)
Chloride: 105 mmol/L (ref 98–111)
Creatinine, Ser: 0.76 mg/dL (ref 0.44–1.00)
GFR calc Af Amer: >60 mL/min (ref >60)
GFR calc non Af Amer: >60 mL/min (ref >60)
Glucose, Bld: 107 mg/dL — ABNORMAL HIGH (ref 70–99)
Potassium: 4.1 mmol/L (ref 3.5–5.1)
Sodium: 139 mmol/L (ref 135–145)

## 2019-06-07 LAB — CBC WITH DIFFERENTIAL/PLATELET
Abs Immature Granulocytes: 0.01 10*3/uL (ref 0.00–0.07)
Basophils Absolute: 0.1 10*3/uL (ref 0.0–0.1)
Basophils Relative: 1 %
Eosinophils Absolute: 0.2 10*3/uL (ref 0.0–0.5)
Eosinophils Relative: 2 %
HCT: 39.6 % (ref 36.0–46.0)
Hemoglobin: 12.2 g/dL (ref 12.0–15.0)
Immature Granulocytes: 0 %
Lymphocytes Relative: 29 %
Lymphs Abs: 1.9 10*3/uL (ref 0.7–4.0)
MCH: 24 pg — ABNORMAL LOW (ref 26.0–34.0)
MCHC: 30.8 g/dL (ref 30.0–36.0)
MCV: 78 fL — ABNORMAL LOW (ref 80.0–100.0)
Monocytes Absolute: 0.4 10*3/uL (ref 0.1–1.0)
Monocytes Relative: 6 %
Neutro Abs: 4 10*3/uL (ref 1.7–7.7)
Neutrophils Relative %: 62 %
Platelets: 308 10*3/uL (ref 150–400)
RBC: 5.08 MIL/uL (ref 3.87–5.11)
RDW: 15.2 % (ref 11.5–15.5)
WBC: 6.6 10*3/uL (ref 4.0–10.5)
nRBC: 0 % (ref 0.0–0.2)

## 2019-06-07 LAB — PROTIME-INR
INR: 0.9 (ref 0.8–1.2)
Prothrombin Time: 12.4 seconds (ref 11.4–15.2)

## 2019-06-07 LAB — APTT: aPTT: 31 seconds (ref 24–36)

## 2019-06-07 MED ORDER — TETANUS-DIPHTH-ACELL PERTUSSIS 5-2.5-18.5 LF-MCG/0.5 IM SUSP
0.5000 mL | Freq: Once | INTRAMUSCULAR | Status: AC
  Administered 2019-06-07: 0.5 mL via INTRAMUSCULAR
  Filled 2019-06-07: qty 0.5

## 2019-06-07 MED ORDER — FENTANYL CITRATE (PF) 100 MCG/2ML IJ SOLN
50.0000 ug | Freq: Once | INTRAMUSCULAR | Status: AC
  Administered 2019-06-07: 23:00:00 50 ug via INTRAVENOUS
  Filled 2019-06-07: qty 2

## 2019-06-07 NOTE — ED Provider Notes (Signed)
[facility] Emergency Department Provider Note  ____________________________________________   First MD Initiated Contact with Patient 06/07/19 2225     (approximate)  I have reviewed the triage vital signs and the nursing notes.   HISTORY  Chief Complaint Fall and Wrist Pain    HPI Michele Molina is a 58 y.o. female who presents with fall.  Patient has been drinking alcohol and she had a mechanical fall about 2 feet down and landed on her butt.  She does not think she hit her head or lost consciousness but patient is clearly intoxicated.  Patient has pain in the left arm as well as pain on her buttock near her coccyx region.  Denies any hip pain.  Denies any chest pain or shortness breath prior to the fall.  Having pain in the arm that is severe, constant, nothing makes it better or worse.          Past Medical History:  Diagnosis Date   Stroke [facility])    Uterine fibroid     Patient Active Problem List   Diagnosis Date Noted   Hypertension 07/24/2017   Depression 07/24/2017    Past Surgical History:  Procedure Laterality Date   DENTAL SURGERY      Prior to Admission medications   Medication Sig Start Date End Date Taking? Authorizing Provider  hydrOXYzine (ATARAX/VISTARIL) 25 MG tablet Take 1 tablet (25 mg total) by mouth every 6 (six) hours as needed for itching. 07/24/17   Johnn Hai, PA-C  predniSONE (DELTASONE) 10 MG tablet Take 6 tablets  today, on day 2 take 5 tablets, day 3 take 4 tablets, day 4 take 3 tablets, day 5 take  2 tablets and 1 tablet the last day 07/24/17   Johnn Hai, PA-C    Allergies Patient has no known allergies.  No family history on file.  Social History Social History   Tobacco Use   Smoking status: Current Every Day Smoker   Smokeless tobacco: Never Used  Substance Use Topics   Alcohol use: Yes   Drug use: No      Review of Systems Constitutional: No fever/chills Eyes: No  visual changes. ENT: No sore throat. Cardiovascular: Denies chest pain. Respiratory: Denies shortness of breath. Gastrointestinal: No abdominal pain.  No nausea, no vomiting.  No diarrhea.  No constipation. Genitourinary: Negative for dysuria. Musculoskeletal: pain in the left arm.  Pain on her coccyx.  Skin: Negative for rash. Neurological: Negative for headaches, focal weakness or numbness. All other ROS negative ____________________________________________   PHYSICAL EXAM:  VITAL SIGNS: Blood pressure (!) 145/93, pulse 82, temperature 98.8 F (37.1 C), temperature source Oral, resp. rate 18, height 5\' 4"  (1.626 m), weight 60 kg, SpO2 97 %.  Constitutional: Alert and oriented. GCS 15 appears intoxicated Eyes: Conjunctivae are normal. EOMI. Head: Atraumatic. Nose: No congestion/rhinnorhea. Mouth/Throat: Mucous membranes are moist.   Neck: No stridor. Trachea Midline. FROM Cardiovascular: Normal rate, regular rhythm. Grossly normal heart sounds.  Good peripheral circulation. No chest wall tenderness Respiratory: Normal respiratory effort.  No retractions. Lungs CTAB. Gastrointestinal: Soft and nontender. No distention. No abdominal bruits.  Musculoskeletal:   RUE: No point tenderness, deformity or other signs of injury. Radial pulse intact. Neuro intact. Full ROM in joint. LUE: pain in the left upper extremity forearm, no pain at elbow.  Good distal pulse.  Neuro intact RLE: No point tenderness, deformity or other signs of injury. DP pulse intact. Neuro intact. Full ROM in joints. LLE:  No point tenderness, deformity or other signs of injury. DP pulse intact. Neuro intact. Full ROM in joints.  No pelvis pain but does have pain at her coccyx right over her anus.   No C/T/L spine tenderness   Neurologic:  Normal speech and language. No gross focal neurologic deficits are appreciated.  Skin:  Skin is warm, dry and intact. No rash noted. Psychiatric: Mood and affect are normal.  Speech and behavior are normal. GU: Deferred   ____________________________________________   LABS (all labs ordered are listed, but only abnormal results are displayed)  Labs Reviewed  CBC WITH DIFFERENTIAL/PLATELET - Abnormal; Notable for the following components:      Result Value   MCV 78.0 (*)    MCH 24.0 (*)    All other components within normal limits  BASIC METABOLIC PANEL - Abnormal; Notable for the following components:   Glucose, Bld 107 (*)    All other components within normal limits  PROTIME-INR  APTT    RADIOLOGY I, Vanessa [place], personally viewed and evaluated these images (plain radiographs) as part of my medical decision making, as well as reviewing the written report by the radiologist.  ED MD interpretation: X-rays without fracture  Official radiology report(s): Dg Sacrum/coccyx  Result Date: 06/07/2019 CLINICAL DATA:  [place] off porch, pain EXAM: SACRUM AND COCCYX - 2+ VIEW COMPARISON:  None. FINDINGS: There is no evidence of fracture or other focal bone lesions. IMPRESSION: Negative. Electronically Signed   By: Donavan Foil M.D.   On: 06/07/2019 23:43   Dg Forearm Left  Result Date: 06/07/2019 CLINICAL DATA:  58 year old female with fall and trauma to the left upper extremity. EXAM: LEFT FOREARM - 2 VIEW; LEFT WRIST - COMPLETE 3+ VIEW COMPARISON:  None. FINDINGS: No definite acute fracture. Faint lucency in the distal radial metaphysis, likely vascular groove. The bones are osteopenic. There is no dislocation. The soft tissues appear unremarkable. IMPRESSION: No definite acute fracture or dislocation. Electronically Signed   By: Anner Crete M.D.   On: 06/07/2019 23:43   Dg Wrist Complete Left  Result Date: 06/07/2019 CLINICAL DATA:  58 year old female with fall and trauma to the left upper extremity. EXAM: LEFT FOREARM - 2 VIEW; LEFT WRIST - COMPLETE 3+ VIEW COMPARISON:  None. FINDINGS: No definite acute fracture. Faint lucency in the distal radial  metaphysis, likely vascular groove. The bones are osteopenic. There is no dislocation. The soft tissues appear unremarkable. IMPRESSION: No definite acute fracture or dislocation. Electronically Signed   By: Anner Crete M.D.   On: 06/07/2019 23:43   Ct Head Wo Contrast  Result Date: 06/07/2019 CLINICAL DATA:  Fall from porch EXAM: CT HEAD WITHOUT CONTRAST CT CERVICAL SPINE WITHOUT CONTRAST TECHNIQUE: Multidetector CT imaging of the head and cervical spine was performed following the standard protocol without intravenous contrast. Multiplanar CT image reconstructions of the cervical spine were also generated. COMPARISON:  11/29/2018 head CT FINDINGS: CT HEAD FINDINGS Brain: There is no mass, hemorrhage or extra-axial collection. The size and configuration of the ventricles and extra-axial CSF spaces are normal. The brain parenchyma is normal, without evidence of acute or chronic infarction. 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 Vascular: No abnormal hyperdensity of the major intracranial arteries or dural venous sinuses. No intracranial atherosclerosis. Skull: The visualized skull base, calvarium and extracranial soft tissues are normal. Sinuses/Orbits: No fluid levels or advanced mucosal thickening of the visualized paranasal sinuses. No mastoid or middle ear effusion. The orbits are normal. CT CERVICAL SPINE  FINDINGS Alignment: No static subluxation. Facets are aligned. Occipital condyles are normally positioned. Skull base and vertebrae: No acute fracture. Soft tissues and spinal canal: No prevertebral fluid or swelling. No visible canal hematoma. Disc levels: No advanced spinal canal or neural foraminal stenosis. Upper chest: No pneumothorax, pulmonary nodule or pleural effusion. Other: Subcentimeter right thyroid nodule. IMPRESSION: 1. No acute intracranial abnormality. 2. No acute fracture or static subluxation of the cervical spine. Electronically Signed   By: Ulyses Jarred M.D.   On: 06/07/2019 23:23   Ct Cervical Spine Wo Contrast  Result Date: 06/07/2019 CLINICAL DATA:  Fall from porch EXAM: CT HEAD WITHOUT CONTRAST CT CERVICAL SPINE WITHOUT CONTRAST TECHNIQUE: Multidetector CT imaging of the head and cervical spine was performed following the standard protocol without intravenous contrast. Multiplanar CT image reconstructions of the cervical spine were also generated. COMPARISON:  11/29/2018 head CT FINDINGS: CT HEAD FINDINGS Brain: There is no mass, hemorrhage or extra-axial collection. The size and configuration of the ventricles and extra-axial CSF spaces are normal. The brain parenchyma is normal, without evidence of acute or chronic infarction. 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 Vascular: No abnormal hyperdensity of the major intracranial arteries or dural venous sinuses. No intracranial atherosclerosis. Skull: The visualized skull base, calvarium and extracranial soft tissues are normal. Sinuses/Orbits: No fluid levels or advanced mucosal thickening of the visualized paranasal sinuses. No mastoid or middle ear effusion. The orbits are normal. CT CERVICAL SPINE FINDINGS Alignment: No static subluxation. Facets are aligned. Occipital condyles are normally positioned. Skull base and vertebrae: No acute fracture. Soft tissues and spinal canal: No prevertebral fluid or swelling. No visible canal hematoma. Disc levels: No advanced spinal canal or neural foraminal stenosis. Upper chest: No pneumothorax, pulmonary nodule or pleural effusion. Other: Subcentimeter right thyroid nodule. IMPRESSION: 1. No acute intracranial abnormality. 2. No acute fracture or static subluxation of the cervical spine. Electronically Signed   By: Ulyses Jarred M.D.   On: 06/07/2019 23:23    ____________________________________________   PROCEDURES  Procedure(s) performed (including Critical  Care):  Procedures   ____________________________________________   INITIAL IMPRESSION / ASSESSMENT AND PLAN / ED COURSE  Michele Molina was evaluated in Emergency Department on 06/07/2019 for the symptoms described in the history of present illness. She was evaluated in the context of the global COVID-19 pandemic, which necessitated consideration that the patient might be at risk for infection with the SARS-CoV-2 virus that causes COVID-19. Institutional protocols and algorithms that pertain to the evaluation of patients at risk for COVID-19 are in a state of rapid change based on information released by regulatory bodies including the CDC and federal and state organizations. These policies and algorithms were followed during the patient's care in the ED.    Patient presents with a mechanical fall while being intoxicated.  This is like patient hit her head but given that she is intoxicated will get CT head evaluate for epidural subdural hematoma CT cervical to evaluate for cervical fracture.  Patient does have pain on her left wrist and forearm will get x-rays to further evaluate.  Patient does have pain at her coccyx so we will also get x-ray of that. This sounds mechanical in nature.  Low suspicion for syncope.  12:13 AM reevaluated patient.  Patient is definitely still intoxicated.  Patient does seem to have snuffbox tenderness so will put a thumb spica splint on.  Patient is able to flex and extend her wrist and is able to move her fingers  to show that her medial, radial and ulnar nerve are all intact.  Denies any sensation changes.  Will have patient off to oncoming team pending sober reevaluation but per nurse patient is already been able to ambulate.   ____________________________________________   FINAL CLINICAL IMPRESSION(S) / ED DIAGNOSES   Final diagnoses:  Fall, initial encounter  Left wrist pain      MEDICATIONS GIVEN DURING THIS VISIT:  Medications  Tdap (BOOSTRIX)  injection 0.5 mL (0.5 mLs Intramuscular Given 06/07/19 2338)  fentaNYL (SUBLIMAZE) injection 50 mcg (50 mcg Intravenous Given 06/07/19 2248)     ED Discharge Orders    None       Note:  This document was prepared using Dragon voice recognition software and may include unintentional dictation errors.   Vanessa [place], MD 06/08/19 [phone]

## 2019-06-07 NOTE — ED Triage Notes (Signed)
Patient brought in to the ED by ems from home. Patient fell of her porch. Patient with complaint of pain to her butt bone. Obvious deformity to left wrist.

## 2019-06-07 NOTE — ED Notes (Signed)
Pt to CT and xray at this time

## 2019-06-08 MED ORDER — ACETAMINOPHEN 500 MG PO TABS
1000.0000 mg | ORAL_TABLET | Freq: Once | ORAL | Status: AC
  Administered 2019-06-08: 03:00:00 1000 mg via ORAL
  Filled 2019-06-08: qty 2

## 2019-06-08 NOTE — ED Notes (Signed)
Pt discharged home after verbalizing understanding of discharge instructions; nad noted. 

## 2019-06-08 NOTE — ED Notes (Signed)
Meal tray and po fluids provided to pt with md consent.

## 2019-06-08 NOTE — ED Provider Notes (Signed)
-----------------------------------------   7:02 AM on 06/08/2019 -----------------------------------------   Blood pressure 105/85, pulse 85, temperature 98.8 F (37.1 C), temperature source Oral, resp. rate 16, height 5\' 4"  (1.626 m), weight 60 kg, SpO2 94 %.  The patient is calm and cooperative at this time.  She ate a sandwich tray, ambulated with steady gait and went back to sleep with the plan to call her ride in the morning. Will discharge when her ride arrives. Strict return precautions given. Patient verbalizes understanding and agrees with plan of care.   Paulette Blanch, MD 06/08/19 [phone]

## 2019-06-08 NOTE — Discharge Instructions (Addendum)
Your work-up was reassuring with negative CT scans and negative x-rays.  However sometimes people could have a fracture other scaphoid wound that is not seen on x-rays.  We have put a splint on you.  You need a repeat x-ray in 2 to 3 weeks.  We have given you orthopedics number to follow-up with.  Return to the ER for any other concerns.

## 2019-06-08 NOTE — ED Notes (Signed)
Pt up to BR with standby assist.  

## 2020-11-23 IMAGING — CT CT HEAD W/O CM
3 series · 15 of 46 positions shown, 18 images · non-contrast
Comparison: 03/26/2015

CLINICAL DATA: Patient fell out of a chair and hit head on floor.

EXAM:
CT HEAD WITHOUT CONTRAST
TECHNIQUE: Contiguous axial images were obtained from the base of the skull
through the vertex without intravenous contrast.

[Series 2: head wo · axial · 0.40mm/px · z∈[+402,+522]mm · 9 of 29 slices shown, 12 images]
[im 3/29  brain]
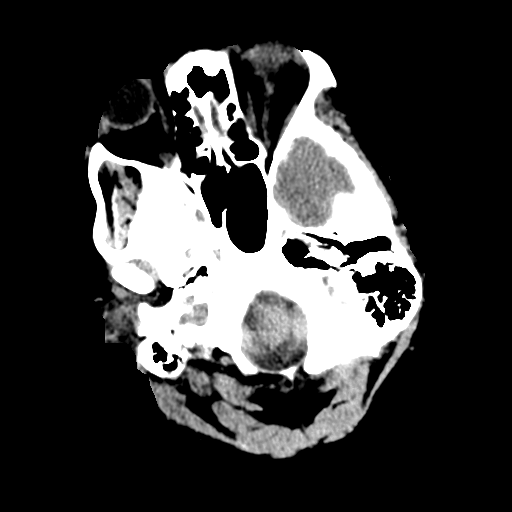
[im 3/29  bone]
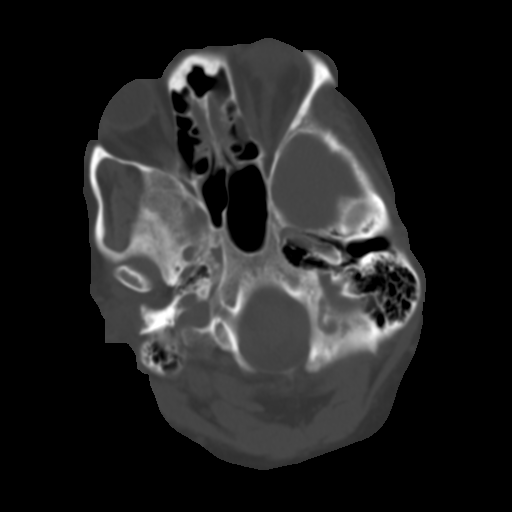
[im 6/29  brain]
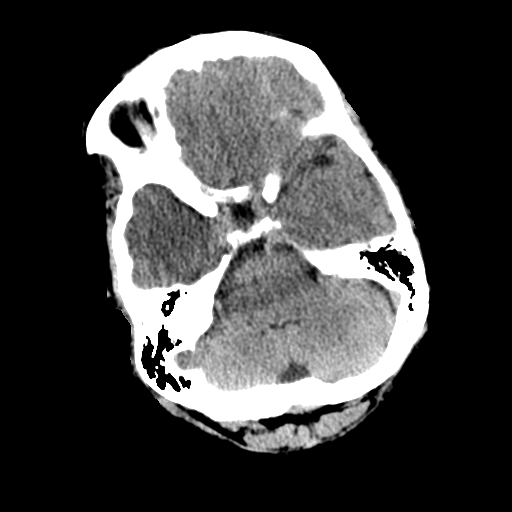
[im 9/29  brain]
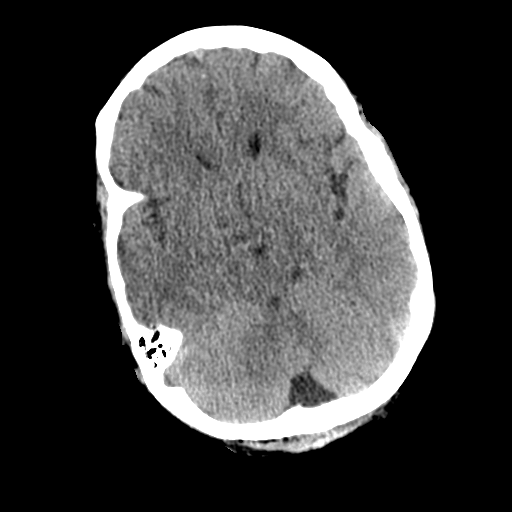
[im 12/29  brain]
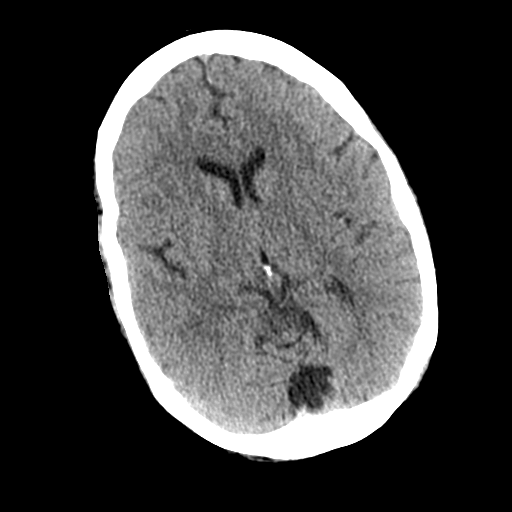
[im 15/29  brain]
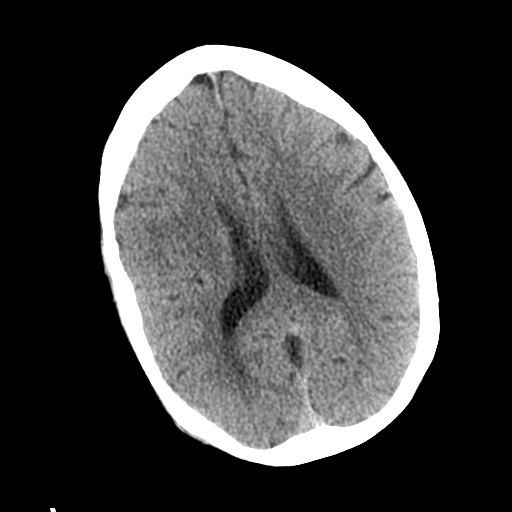
[im 15/29  bone]
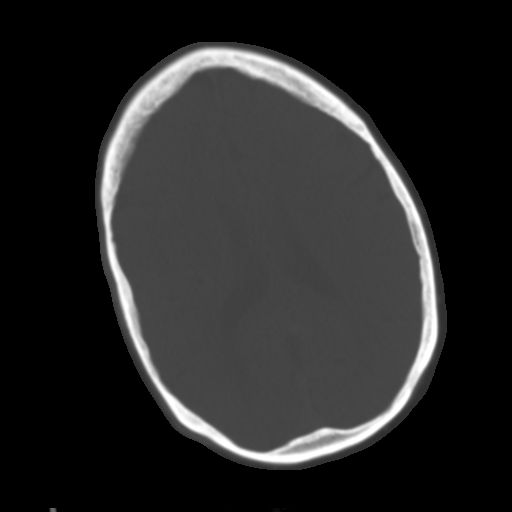
[im 18/29  brain]
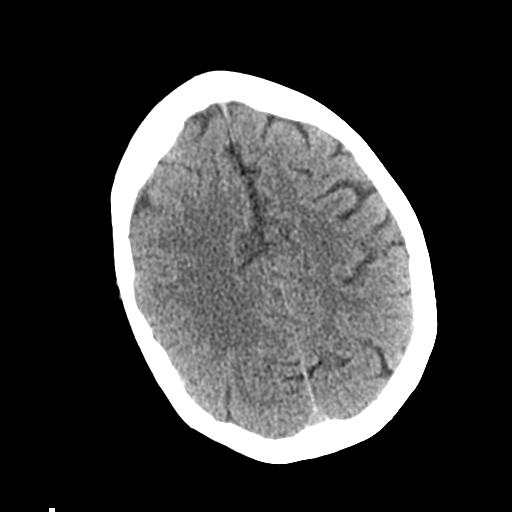
[im 21/29  brain]
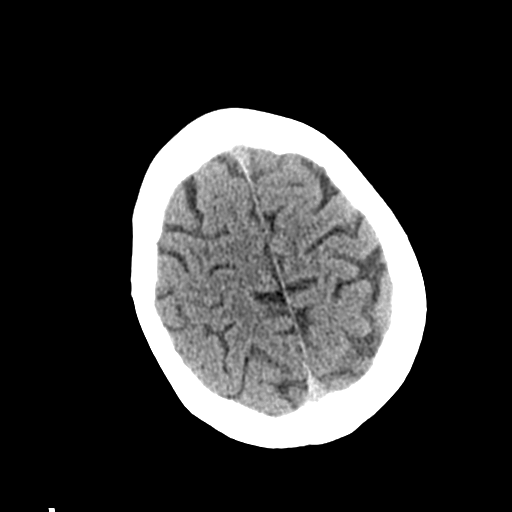
[im 24/29  brain]
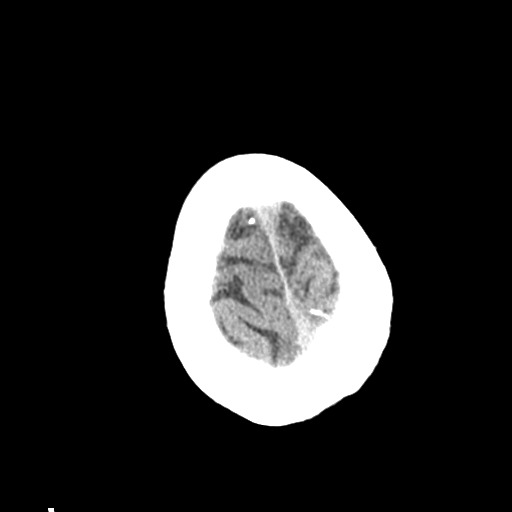
[im 27/29  brain]
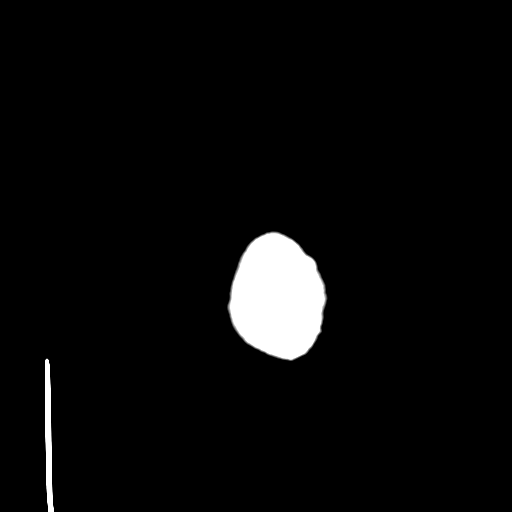
[im 27/29  bone]
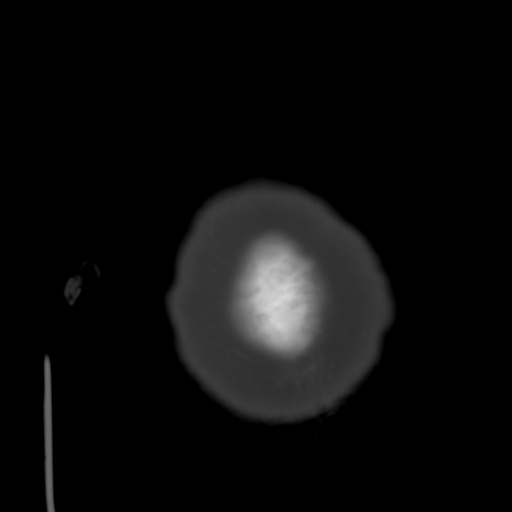

[Series 4: coronal soft tissue · coronal · 0.27mm/px · 3 of 62 slices shown]
[im 21/62  brain]
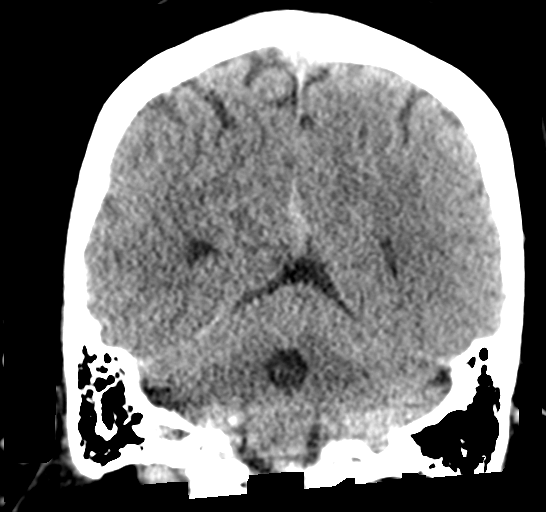
[im 28/62  brain]
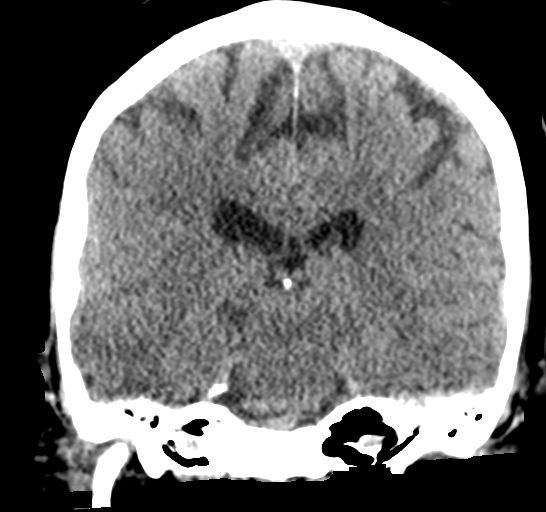
[im 34/62  brain]
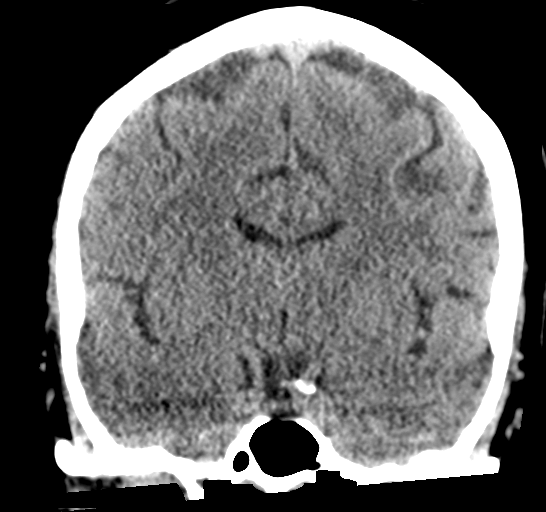

[Series 5: sagittal soft tissue · sagittal · 0.28mm/px · 3 of 46 slices shown]
[im 16/46  brain]
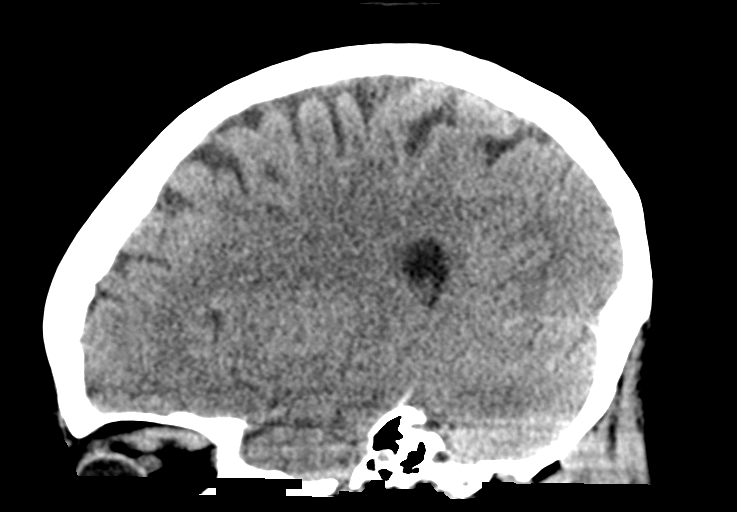
[im 23/46  brain]
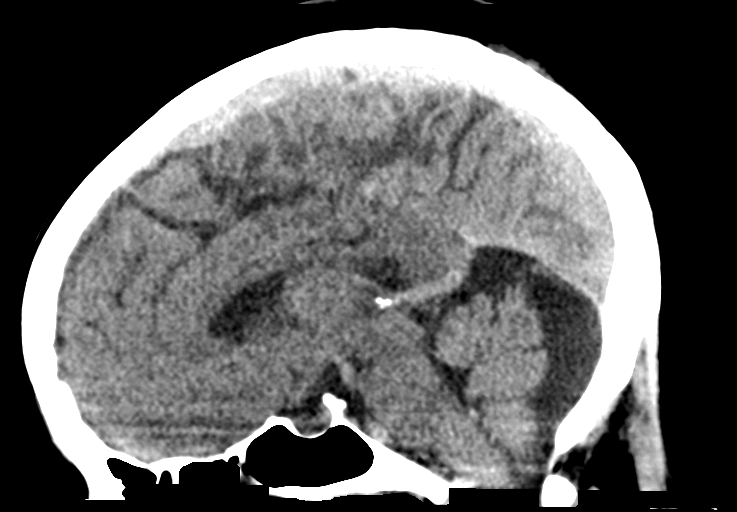
[im 31/46  brain]
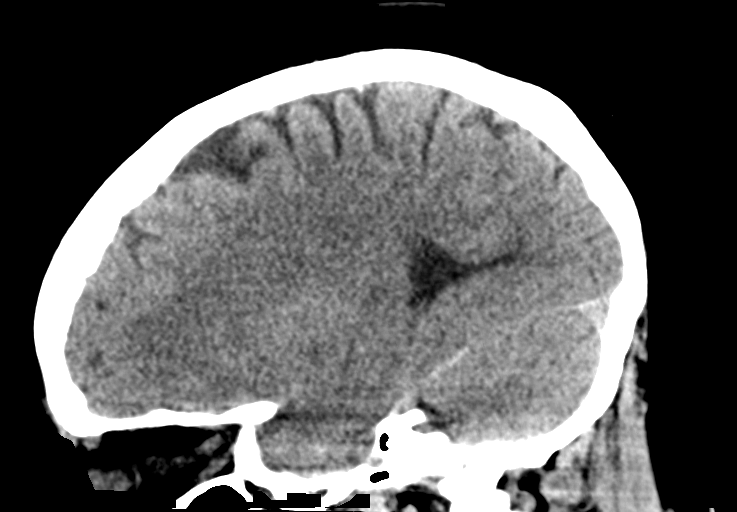

[15 of 46 positions shown; findings below may reference images not displayed]

FINDINGS: Brain: There is no evidence for acute hemorrhage, hydrocephalus,
mass lesion, or abnormal extra-axial fluid collection. No definite
CT evidence for acute infarction.

Vascular: No hyperdense vessel or unexpected calcification.

Skull: No evidence for fracture. No worrisome lytic or sclerotic
lesion.

Sinuses/Orbits: The visualized paranasal sinuses and mastoid air
cells are clear. Visualized portions of the globes and intraorbital
fat are unremarkable.

Other: None.
IMPRESSION: Stable.  No acute intracranial abnormality.
